# Patient Record
Sex: Male | Born: 1965 | Race: White | Hispanic: No | Marital: Married | Smoking: Current some day smoker
Health system: Southern US, Community
[De-identification: ages and names within clinical notes are randomized; demographics above are authoritative.]

---

## 2003-10-02 ENCOUNTER — Inpatient Hospital Stay (HOSPITAL_COMMUNITY): Admission: EM | Admit: 2003-10-02 | Discharge: 2003-10-03 | Payer: Self-pay | Admitting: Emergency Medicine

## 2005-12-01 ENCOUNTER — Ambulatory Visit: Payer: Self-pay | Admitting: Family Medicine

## 2006-10-17 ENCOUNTER — Emergency Department (HOSPITAL_COMMUNITY): Admission: EM | Admit: 2006-10-17 | Discharge: 2006-10-17 | Payer: Self-pay | Admitting: Emergency Medicine

## 2007-02-25 ENCOUNTER — Ambulatory Visit: Payer: Self-pay | Admitting: Family Medicine

## 2008-01-25 IMAGING — CR DG CHEST 1V PORT
2 series · 2 of 2 positions shown · non-contrast
Comparison: 10/02/2003

CLINICAL DATA: Chest pain. 
 PORTABLE CHEST - 10/17/2006 AT 0449 HOURS:

[view not recorded (1 of 2)]
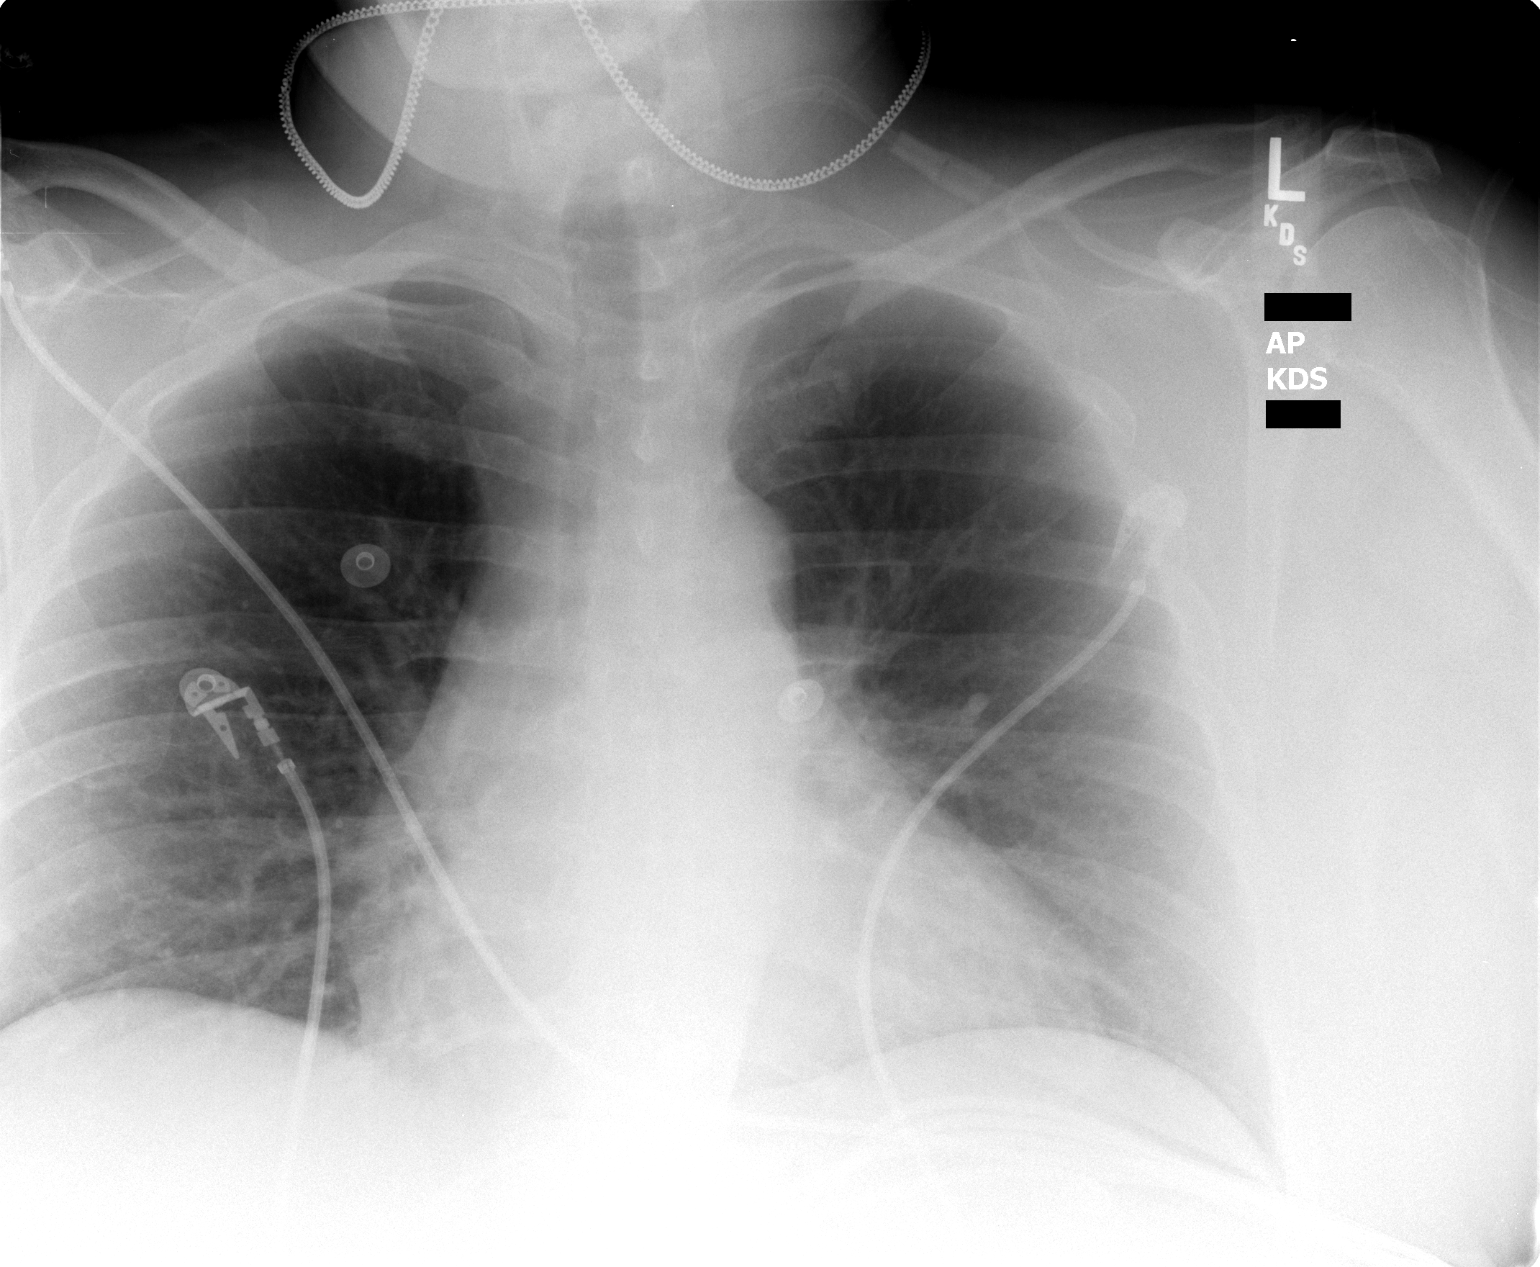

[view not recorded (2 of 2)]
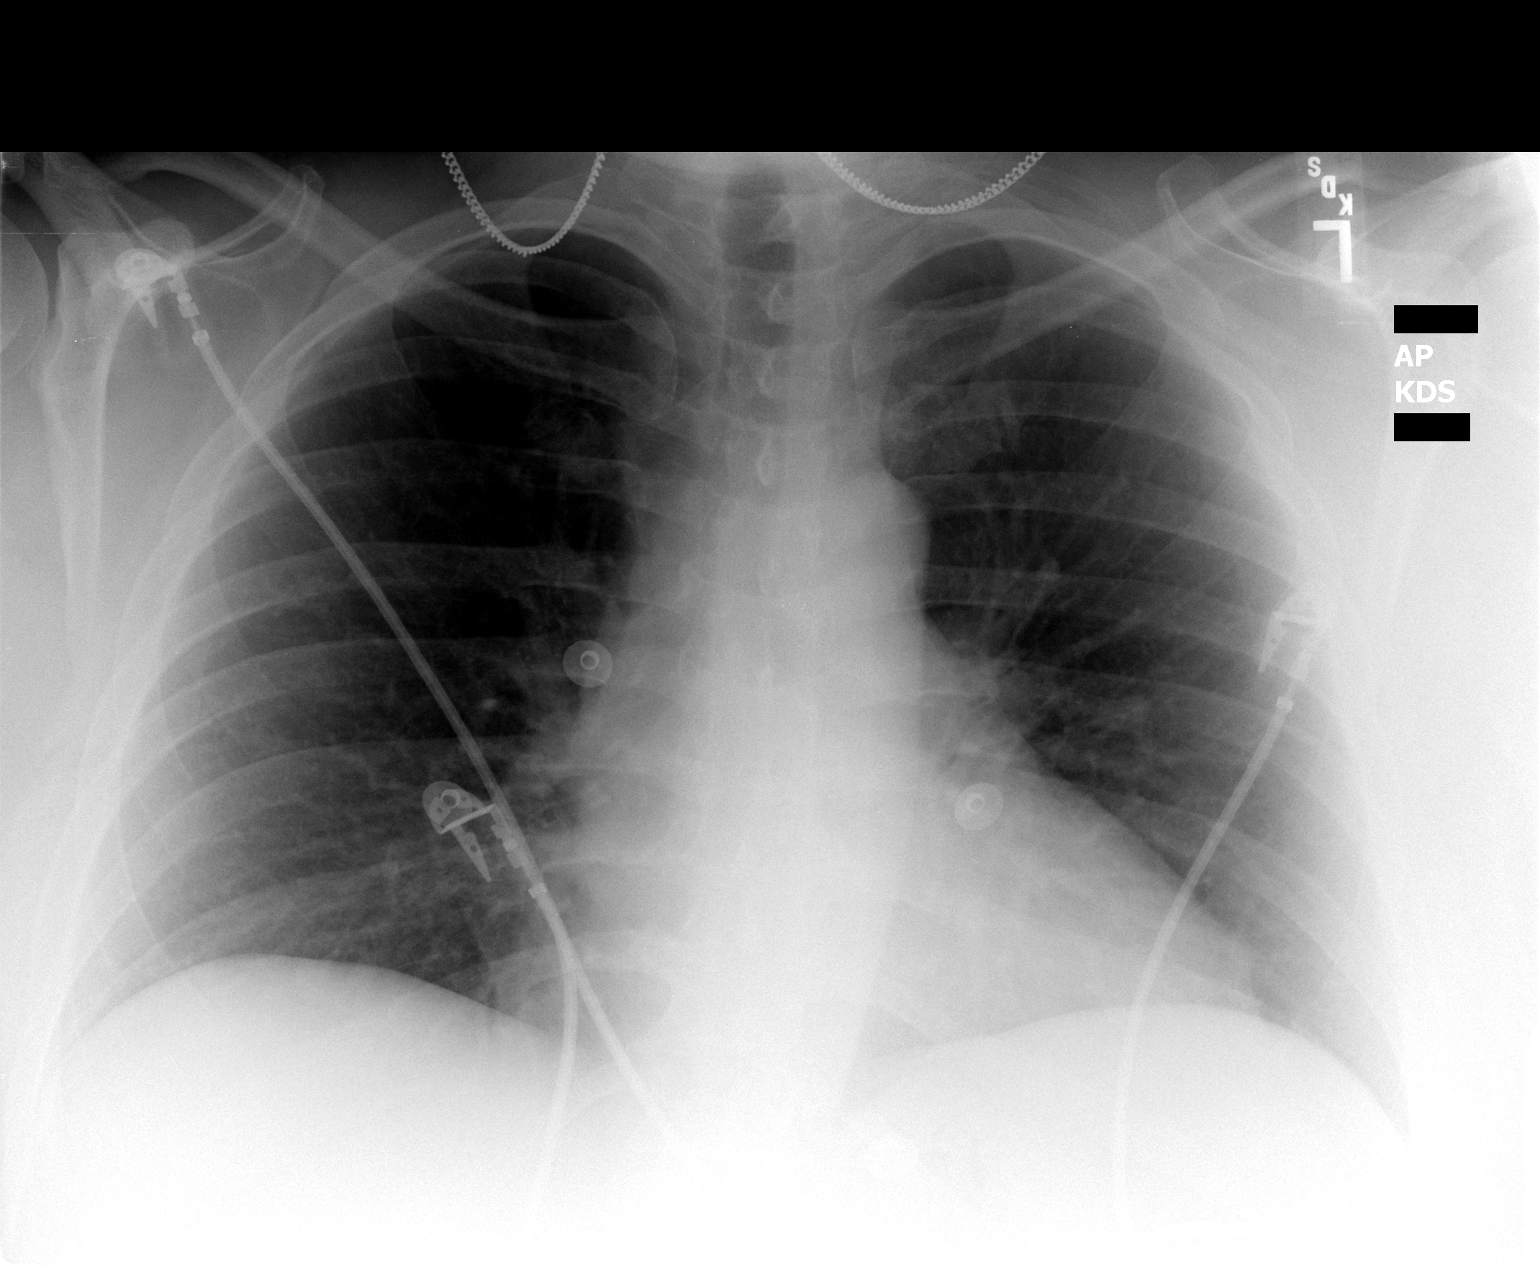

[2 of 2 positions shown; findings below may reference images not displayed]

FINDINGS: The lungs are clear.  Cardiomegaly is stable.  No bony abnormality is seen.  Stable cardiomegaly.
IMPRESSION: No active lung disease.

## 2008-03-30 ENCOUNTER — Ambulatory Visit: Payer: Self-pay | Admitting: Family Medicine

## 2010-06-24 ENCOUNTER — Encounter (INDEPENDENT_AMBULATORY_CARE_PROVIDER_SITE_OTHER): Payer: PRIVATE HEALTH INSURANCE | Admitting: Family Medicine

## 2010-06-24 DIAGNOSIS — E669 Obesity, unspecified: Secondary | ICD-10-CM

## 2010-06-24 DIAGNOSIS — Z0000 Encounter for general adult medical examination without abnormal findings: Secondary | ICD-10-CM

## 2010-08-08 NOTE — Discharge Summary (Signed)
NAME:  TATEM, FESLER NO.:  [id number]   MEDICAL RECORD NO.:  [id number]                   PATIENT TYPE:  INP   LOCATION:  3733                                 FACILITY:  [facility]   PHYSICIAN:  Nicki Guadalajara, M.D.                  DATE OF BIRTH:  Apr 22, 1965   DATE OF ADMISSION:  10/02/2003  DATE OF DISCHARGE:  10/03/2003                                 DISCHARGE SUMMARY   ADMISSION DIAGNOSES:  1. Chest pain.  2. Morbid obesity.  3. Tobacco use.  4. Anxiety.   DISCHARGE DIAGNOSES:  1. Chest pain.  Status post negative cardiac catheterization.  Status post     negative D-dimer.  Felt that chest pain was most likely related to     gastroesophageal reflux disease.  2. Morbid obesity.  3. Tobacco use.  4. Anxiety.  5. Status post cardiac catheterization on October 02, 2003 by Dr. Daphene Jaeger.     This revealed normal coronary arteries, normal left ventricular function.  6. Possible obstructive sleep apnea.  Will need follow up outpatient sleep     study.   HISTORY OF PRESENT ILLNESS:  Mr. Godeaux is a 45 year old white male who  awoke on the day of admission and had some chest discomfort and dizziness.  He went to work, but the pain persisted.  He describes it as a tightness,  almost like gas.  There was some radiation to the neck and the left leg, and  also had left leg discomfort.  The symptoms did not improve, and he finally  went to the [facility].  He was given nitroglycerin with relief.  They saw  some changes on his EKG, and had him sent to [facility] emergency room.  He  was given 2 more nitroglycerin via EMS.  His initial cardiac markers are  negative.  EKG in the emergency room shows sinus rhythm with no ST-T change.  He is still having some discomfort, which is mild, but is not in significant  distress at the time of our evaluation.   PHYSICAL EXAMINATION:  VITAL SIGNS:  He is stable with a blood pressure of  140/83, heart rate 82.  No  significant abnormality on exam.   At this point, he was seen and evaluated by Dr. Daphene Jaeger.  He reviews that  the chest pain had been a 7/10, and had improved, but not completely  resolved after getting 3 nitroglycerin.  The patient was still experiencing  some chest heaviness.  He was being treated with IV heparin, IV  nitroglycerin, and aspirin.  Dr. Tresa Endo reviewed the possible need to proceed  with definitive cardiac catheterization, given the patient's ongoing chest  pain, with the patient.  The patient understood risks and benefits of the  procedure, and was agreeable to proceed.   HOSPITAL COURSE:  On September 02, 2003, he underwent cardiac catheterization by  Dr. Daphene Jaeger.  He was found to have normal coronary arteries, normal left  ventricular function.  He tolerated this well without complications.  At  this point, we planned for lifestyle modifications including tobacco  cessation, weight loss, and exercise, as well as follow up his lipid status.   On the morning of October 03, 2003, Mr. Major was feeling well.  He is  afebrile, blood pressure is 109/63, heart rate 63, oxygen saturation 97% on  room air.  Cardiac enzymes are negative.  We had ordered a D-dimer in the  ER, which has not yet been done.  His catheterization site is well healed.  Lungs are clear.  Heart had regular rhythm without murmur, rub, or gallop.  Denied any chest pain.  At this point, he was seen and evaluated by Dr. Lavonne Chick.  At this point, the D-dimer results are pending.  It is felt that if  the D-dimer is normal, then the patient will be deemed stable for discharge  home.   It was felt at that time that his chest pain was most likely GI related.   HOSPITAL CONSULTS:  None.   HOSPITAL PROCEDURES:  Cardiac catheterization on October 02, 2003 by Dr. Daphene Jaeger.  Please see his dictated report for all findings.  He was found to  have normal coronary arteries and normal left ventricular function.  He   tolerated the procedure well without complication.   LABORATORY DATA:  Cardiac markers in the ER negative x2.  Cardiac enzymes  show CK's of 69, 57, 64.  CK-MB's are 1.1, 0.8, and 1.1.  Troponin is 0.01,  0.01, and 0.01.  Lipid profile shows a total cholesterol of 180,  triglycerides of 168, HDL 40, LDL 106.  There was another lipid profile done  that shows a total cholesterol of 190, triglycerides 95, HDL 49, LDL 122.  Pro time 12.9, INR of 1.0.  PTT 28.  D-dimer is 0.22, which is normal.  White count is 9.7, hemoglobin 15.1, hematocrit 43.3, platelets 271.  Sodium  138, potassium 4.1, BUN 13, creatinine 1.0.   Chest x-ray on October 02, 2003 shows no acute disease.   EKG shows normal sinus rhythm at 76 beats per minute.  He does have some  poor anterior R wave progression.  Nonspecific ST-T change.   DISCHARGE MEDICATIONS:  1. Zoloft 50 mg daily.  2. Nexium 40 mg once a day.  3. After he completes his one month, he could go to over-the-counter PPI,     such as Prilosec or Pepcid.   ACTIVITY:  No strenuous activity, lifting greater than 5 pounds, driving, or  sexual activity for 3 days.   DIET:  Low-fat diet.   DISCHARGE INSTRUCTIONS:  1. Call [phone] for any bloating or increased redness in the groin site.  2. He is supposed to contact Dr. __________ for follow up.  3. We have discussed diet and exercise changes.  4. The office was to call the patient with an appointment to see Dr. Tresa Endo     in the next 2-3 weeks.      Mary B. Easley, P.A.-C.                   Nicki Guadalajara, M.D.    MBE/MEDQ  D:  11/05/2003  T:  11/05/2003  Job:  [id number]   cc:   Julieanne Manson(?), M.D.

## 2010-08-08 NOTE — Cardiovascular Report (Signed)
NAME:  Corey Dyer NO.:  [id number]   MEDICAL RECORD NO.:  [id number]                   PATIENT TYPE:  INP   LOCATION:  3733                                 FACILITY:  [facility]   PHYSICIAN:  Nicki Guadalajara, M.D.                  DATE OF BIRTH:  [birth year]   DATE OF PROCEDURE:  10/02/2003  DATE OF DISCHARGE:                              CARDIAC CATHETERIZATION   INDICATIONS:  [name] is a 45 year old white male who has a 21-  year history of tobacco use, currently smoking one pack per day.  He also  has a history of significant obesity and is fairly sedentary but does have a  fairly stressful job working at __________ in [place].  He does have a  history of indigestion symptomatology.  Today approximately at 6:30 a.m. he  developed new-onset substernal chest pressure.  This seemed to radiate from  his left aspect of his chest to the center of the chest.  He became  diaphoretic and clammy as well as associated dizziness.  His chest pain  persisted off and on while he was attempting to work.  He ultimately  presented to [facility] in [place], where his symptoms were improved but  not totally relieved with sublingual nitroglycerin.  His ECG showed poor  anterior R-wave progression.  The patient was transported to [facility]. Upon arrival he continued to have substernal chest tightness and  pressure.  The additional symptoms seemed to improve with nitroglycerin  administration sublingually as well as intravenously.  His ECG showed a T-  wave inversion in lead III with poor R-wave progression anteriorly.  Because  of chest pain of six hours' duration, his young age with risk factors,  definitive diagnostic cardiac catheterization was recommended.   PROCEDURE:  Upon arrival to the cardiac catheterization laboratory, the  patient was pain-free on IV nitroglycerin drip.  His right femoral artery  was punctured anteriorly and a 5  French sheath was inserted.  Diagnostic  catheterization was done with 5 French Judkins 4 left and right coronary  catheters.  A pigtail catheter was used for biplane cine left  ventriculography.  Distal aortography was also performed.  Hemostasis was  attained by direct manual pressure.  The patient tolerated the procedure  well and returned to his room in satisfactory condition.   HEMODYNAMIC DATA:  Central aortic pressure is 130/89, left ventricular  pressure 130/10, post A-wave 15.   ANGIOGRAPHIC DATA:  1. Left main coronary artery was a very short vessel, which essentially     immediately bifurcated into an LAD and left circumflex system.  2. The LAD was angiographically normal and extended and wrapped around the     LV apex.  This vessel gave rise to several septal perforating arteries,     several small diagonal vessels, and one major diagonal vessel.  The  vessel and its branches were angiographically normal.  3. The circumflex vessel was a co-dominant vessel that supplied two proximal     marginal vessels, a distal third marginal vessel, and ended in a     posterolateral coronary artery.  The circumflex was angiographically     normal.  4. The right coronary artery was angiographically normal and gave rise to a     PDA, with small distal RCA system.   Biplane cine left ventriculography revealed normal global LV contractility.  There were no focal segmental wall motion abnormalities.   Distal aortography did not demonstrate any renal artery stenosis.  There was  no significant aortoiliac disease.   IMPRESSION:  1. Normal left ventricular function.  2. Normal coronary arteries.                                               Nicki Guadalajara, M.D.    TK/MEDQ  D:  10/02/2003  T:  10/03/2003  Job:  [id number]   cc:   Sharlot Gowda, M.D.  [address]  [place], [place] [address]  Fax: [phone]   Orville Govern   [facility] [address], [place]

## 2011-01-05 LAB — POCT CARDIAC MARKERS
Myoglobin, poc: 48.2
Operator id: 196461

## 2011-01-05 LAB — I-STAT 8, (EC8 V) (CONVERTED LAB)
BUN: 9
Bicarbonate: 25.5 — ABNORMAL HIGH
HCT: 49
Sodium: 141
TCO2: 27
pH, Ven: 7.375 — ABNORMAL HIGH

## 2011-01-05 LAB — POCT I-STAT CREATININE: Operator id: 196461

## 2013-05-04 ENCOUNTER — Emergency Department (HOSPITAL_COMMUNITY)
Admission: EM | Admit: 2013-05-04 | Discharge: 2013-05-04 | Disposition: A | Discharge status: Home / Self Care | Payer: PRIVATE HEALTH INSURANCE | Attending: Emergency Medicine | Admitting: Emergency Medicine

## 2013-05-04 ENCOUNTER — Emergency Department (HOSPITAL_COMMUNITY): Payer: PRIVATE HEALTH INSURANCE

## 2013-05-04 ENCOUNTER — Encounter (HOSPITAL_COMMUNITY): Payer: Self-pay | Admitting: Emergency Medicine

## 2013-05-04 DIAGNOSIS — Z79899 Other long term (current) drug therapy: Secondary | ICD-10-CM | POA: Insufficient documentation

## 2013-05-04 DIAGNOSIS — R296 Repeated falls: Secondary | ICD-10-CM | POA: Insufficient documentation

## 2013-05-04 DIAGNOSIS — S82839A Other fracture of upper and lower end of unspecified fibula, initial encounter for closed fracture: Secondary | ICD-10-CM

## 2013-05-04 DIAGNOSIS — F17200 Nicotine dependence, unspecified, uncomplicated: Secondary | ICD-10-CM | POA: Insufficient documentation

## 2013-05-04 DIAGNOSIS — S82899A Other fracture of unspecified lower leg, initial encounter for closed fracture: Secondary | ICD-10-CM | POA: Insufficient documentation

## 2013-05-04 DIAGNOSIS — Y9301 Activity, walking, marching and hiking: Secondary | ICD-10-CM | POA: Insufficient documentation

## 2013-05-04 DIAGNOSIS — X500XXA Overexertion from strenuous movement or load, initial encounter: Secondary | ICD-10-CM | POA: Insufficient documentation

## 2013-05-04 DIAGNOSIS — Y929 Unspecified place or not applicable: Secondary | ICD-10-CM | POA: Insufficient documentation

## 2013-05-04 MED ORDER — HYDROCODONE-ACETAMINOPHEN 5-325 MG PO TABS: 1.0000 | ORAL_TABLET | ORAL | Status: DC | PRN

## 2013-05-04 MED ORDER — IBUPROFEN 800 MG PO TABS: 800.0000 mg | ORAL_TABLET | Freq: Three times a day (TID) | ORAL | Status: DC

## 2013-05-04 NOTE — ED Provider Notes (Signed)
CSN: [id number]     Arrival date & time 05/04/13  [id number] History   First MD Initiated Contact with Patient 05/04/13 [phone]     Chief Complaint  Patient presents with  . Ankle Pain     (Consider location/radiation/quality/duration/timing/severity/associated sxs/prior Treatment) HPI Corey Dyer is a 48 y.o. male who presents to emergency department complaining of right ankle injury. Patient states that he was walking down the steps from his back when the last step of the deck cracked and broke, and he states he twisted his right ankle and fell down. Patient states this happened 2 days ago. Patient states that he has been at home since then, elevating his ankle, icing, but states this morning pain has worsened. Patient states pain is worsened with weightbearing and with movement of the ankle. He denies any numbness or weakness in the foot. He denies any other injuries. He denies any pain in his knee. He did not take any medications for this.    History reviewed. No pertinent past medical history. History reviewed. No pertinent past surgical history. No family history on file. History  Substance Use Topics  . Smoking status: Current Some Day Smoker -- 0.10 packs/day    Types: Cigarettes  . Smokeless tobacco: Not on file  . Alcohol Use: Yes    Review of Systems  Constitutional: Negative for fever and chills.  Respiratory: Negative for cough, chest tightness and shortness of breath.   Cardiovascular: Negative for chest pain, palpitations and leg swelling.  Musculoskeletal: Positive for arthralgias and joint swelling.  Skin: Negative for rash.  Allergic/Immunologic: Negative for immunocompromised state.  Neurological: Negative for weakness and numbness.      Allergies  Review of patient's allergies indicates no known allergies.  Home Medications   Current Outpatient Rx  Name  Route  Sig  Dispense  Refill  . Famotidine (PEPCID AC PO)   Oral   Take 1 tablet by mouth daily.        Marland Kitchen. ibuprofen (ADVIL,MOTRIN) 200 MG tablet   Oral   Take 400 mg by mouth every 6 (six) hours as needed for mild pain or moderate pain.          BP 154/105  Pulse 88  Temp(Src) 98.9 F (37.2 C) (Oral)  Resp 20  SpO2 93% Physical Exam  Nursing note and vitals reviewed. Constitutional: He is oriented to person, place, and time. He appears well-developed and well-nourished. No distress.  HENT:  Head: Normocephalic and atraumatic.  Eyes: Conjunctivae are normal.  Neck: Neck supple.  Cardiovascular: Normal rate, regular rhythm and normal heart sounds.   Pulmonary/Chest: Effort normal. No respiratory distress. He has no wheezes. He has no rales.  Musculoskeletal: He exhibits no edema.  Normal appearing right ankle, with maybe just mild swelling over lateral malleolus. Tender to palpation over lateral malleolus. No tenderness over proximal fibula, normal knee, Achilles tendon nontender and is intact. The tenderness over medial malleolus. Foot is normal. Cap refill normal distally. Pain with dorsiflexion, plantar flexion, inversion.  Neurological: He is alert and oriented to person, place, and time.  Skin: Skin is warm and dry.    ED Course  Procedures (including critical care time) Labs Review Labs Reviewed - No data to display Imaging Review Dg Ankle Complete Right  05/04/2013   CLINICAL DATA:  Lateral and posterior right ankle pain.  EXAM: RIGHT ANKLE - COMPLETE 3+ VIEW  COMPARISON:  None.  FINDINGS: The patient has a nondisplaced, acute spiral fracture of the  distal diaphysis and metaphysis of the right fibula with associated soft tissue swelling. No other acute bony or joint abnormality is identified. Small plantar calcaneal spur is noted.  IMPRESSION: Nondisplaced spiral fracture distal fibula.   Electronically Signed   By: Drusilla Kanner M.D.   On: 05/04/2013 08:40    EKG Interpretation   None       MDM   Final diagnoses:  Fracture of distal fibula    Patient with  right ankle fracture, spiral fracture seen on the x-ray of the distal fibula. Will splint, crutches provided. Followup with orthopedics Dr. He is neurovascularly intact otherwise.  Filed Vitals:   05/04/13 0810  BP: 154/105  Pulse: 88  Temp: 98.9 F (37.2 C)  TempSrc: Oral  Resp: 20  SpO2: 93%      Keghan Mcfarren A Cerys Winget, PA-C 05/04/13 1052

## 2013-05-04 NOTE — Discharge Instructions (Signed)
Take ibuprofen for pain. norco for severe pain. Keep leg elevated. Ice. Follow up with orthopedics doctor next week.    Ankle Fracture A fracture is a break in the bone. A cast or splint is used to protect and keep your injured bone from moving.  HOME CARE INSTRUCTIONS   Use your crutches as directed.  To lessen the swelling, keep the injured leg elevated while sitting or lying down.  Apply ice to the injury for 15-20 minutes, 03-04 times per day while awake for 2 days. Put the ice in a plastic bag and place a thin towel between the bag of ice and your cast.  If you have a plaster or fiberglass cast:  Do not try to scratch the skin under the cast using sharp or pointed objects.  Check the skin around the cast every day. You may put lotion on any red or sore areas.  Keep your cast dry and clean.  If you have a plaster splint:  Wear the splint as directed.  You may loosen the elastic around the splint if your toes become numb, tingle, or turn cold or blue.  Do not put pressure on any part of your cast or splint; it may break. Rest your cast only on a pillow the first 24 hours until it is fully hardened.  Your cast or splint can be protected during bathing with a plastic bag. Do not lower the cast or splint into water.  Take medications as directed by your caregiver. Only take over-the-counter or prescription medicines for pain, discomfort, or fever as directed by your caregiver.  Do not drive a vehicle until your caregiver specifically tells you it is safe to do so.  If your caregiver has given you a follow-up appointment, it is very important to keep that appointment. Not keeping the appointment could result in a chronic or permanent injury, pain, and disability. If there is any problem keeping the appointment, you must call back to this facility for assistance. SEEK IMMEDIATE MEDICAL CARE IF:   Your cast gets damaged or breaks.  You have continued severe pain or more swelling  than you did before the cast was put on.  Your skin or toenails below the injury turn blue or gray, or feel cold or numb.  There is a bad smell or new stains and/or purulent (pus like) drainage coming from under the cast. If you do not have a window in your cast for observing the wound, a discharge or minor bleeding may show up as a stain on the outside of your cast. Report these findings to your caregiver. MAKE SURE YOU:   Understand these instructions.  Will watch your condition.  Will get help right away if you are not doing well or get worse. Document Released: 03/06/2000 Document Revised: 06/01/2011 Document Reviewed: 10/06/2012 [facility] Patient Information 2014 [place], [place].

## 2013-05-04 NOTE — ED Provider Notes (Signed)
Medical screening examination/treatment/procedure(s) were performed by non-physician practitioner and as supervising physician I was immediately available for consultation/collaboration.  EKG Interpretation   None         Dagmar HaitWilliam Annaliese Saez, MD 05/04/13 1112

## 2013-05-04 NOTE — ED Notes (Signed)
Pt hurt right ankle on Tuesday night; states edge of step on deck broke and pt fell; feels like ankle twisted

## 2013-06-19 ENCOUNTER — Other Ambulatory Visit (HOSPITAL_COMMUNITY): Payer: Self-pay | Admitting: Physical Medicine and Rehabilitation

## 2013-06-19 ENCOUNTER — Ambulatory Visit (HOSPITAL_COMMUNITY)
Admission: RE | Admit: 2013-06-19 | Discharge: 2013-06-19 | Disposition: A | Discharge status: Home / Self Care | Payer: PRIVATE HEALTH INSURANCE | Source: Ambulatory Visit | Attending: Physical Medicine and Rehabilitation | Admitting: Physical Medicine and Rehabilitation

## 2013-06-19 DIAGNOSIS — M79609 Pain in unspecified limb: Secondary | ICD-10-CM | POA: Insufficient documentation

## 2013-06-19 DIAGNOSIS — M7989 Other specified soft tissue disorders: Secondary | ICD-10-CM

## 2013-06-19 NOTE — Progress Notes (Signed)
Bilateral lower extremity venous duplex completed.  Right:  DVT noted in the distal femoral, popliteal, and posterior tibial veins.  No evidence of superficial thrombosis.  No Baker's cyst.  Left:  No evidence of DVT, superficial thrombosis, or Baker's cyst.  . 

## 2013-06-20 ENCOUNTER — Encounter: Payer: Self-pay | Admitting: Family Medicine

## 2013-06-20 ENCOUNTER — Ambulatory Visit (INDEPENDENT_AMBULATORY_CARE_PROVIDER_SITE_OTHER): Payer: PRIVATE HEALTH INSURANCE | Admitting: Family Medicine

## 2013-06-20 VITALS — BP 130/98 | HR 80 | Ht 69.0 in | Wt 270.0 lb

## 2013-06-20 DIAGNOSIS — I82409 Acute embolism and thrombosis of unspecified deep veins of unspecified lower extremity: Secondary | ICD-10-CM

## 2013-06-20 NOTE — Patient Instructions (Signed)
Deep Vein Thrombosis A deep vein thrombosis (DVT) is a blood clot that develops in the deep, larger veins of the leg, arm, or pelvis. These are more dangerous than clots that might form in veins near the surface of the body. A DVT can lead to complications if the clot breaks off and travels in the bloodstream to the lungs.  A DVT can damage the valves in your leg veins, so that instead of flowing upward, the blood pools in the lower leg. This is called post-thrombotic syndrome, and it can result in pain, swelling, discoloration, and sores on the leg. CAUSES Usually, several things contribute to blood clots forming. Contributing factors include:  The flow of blood slows down.  The inside of the vein is damaged in some way.  You have a condition that makes blood clot more easily. RISK FACTORS Some people are more likely than others to develop blood clots. Risk factors include:   Older age, especially over 75 years of age.  Having a family history of blood clots or if you have already had a blot clot.  Having major or lengthy surgery. This is especially true for surgery on the hip, knee, or belly (abdomen). Hip surgery is particularly high risk.  Breaking a hip or leg.  Sitting or lying still for a long time. This includes long-distance travel, paralysis, or recovery from an illness or surgery.  Having cancer or cancer treatment.  Having a long, thin tube (catheter) placed inside a vein during a medical procedure.  Being overweight (obese).  Pregnancy and childbirth.  Hormone changes make the blood clot more easily during pregnancy.  The fetus puts pressure on the veins of the pelvis.  There is a risk of injury to veins during delivery or a caesarean. The risk is highest just after childbirth.  Medicines with the male hormone estrogen. This includes birth control pills and hormone replacement therapy.  Smoking.  Other circulation or heart problems.  SIGNS AND SYMPTOMS When  a clot forms, it can either partially or totally block the blood flow in that vein. Symptoms of a DVT can include:  Swelling of the leg or arm, especially if one side is much worse.  Warmth and redness of the leg or arm, especially if one side is much worse.  Pain in an arm or leg. If the clot is in the leg, symptoms may be more noticeable or worse when standing or walking. The symptoms of a DVT that has traveled to the lungs (pulmonary embolism, PE) usually start suddenly and include:  Shortness of breath.  Coughing.  Coughing up blood or blood-tinged phlegm.  Chest pain. The chest pain is often worse with deep breaths.  Rapid heartbeat. Anyone with these symptoms should get emergency medical treatment right away. Call your local emergency services (911 in the [place] if you have these symptoms. DIAGNOSIS If a DVT is suspected, your health care provider will take a full medical history and perform a physical exam. Tests that also may be required include:  Blood tests, including studies of the clotting properties of the blood.  Ultrasonography to see if you have clots in your legs or lungs.  X-rays to show the flow of blood when dye is injected into the veins (venography).  Studies of your lungs if you have any chest symptoms. PREVENTION  Exercise the legs regularly. Take a brisk 30-minute walk every day.  Maintain a weight that is appropriate for your height.  Avoid sitting or lying in bed   for long periods of time without moving your legs.  Women, particularly those over the age of 45 years, should consider the risks and benefits of taking estrogen medicines, including birth control pills.  Do not smoke, especially if you take estrogen medicines.  Long-distance travel can increase your risk of DVT. You should exercise your legs by walking or pumping the muscles every hour.  In-hospital prevention:  Many of the risk factors above relate to situations that exist with  hospitalization, either for illness, injury, or elective surgery.  Your health care provider will assess you for the need for venous thromboembolism prophylaxis when you are admitted to the hospital. If you are having surgery, your surgeon will assess you the day of or day after surgery.  Prevention may include medical and nonmedical measures. TREATMENT Once identified, a DVT can be treated. It can also be prevented in some circumstances. Once you have had a DVT, you may be at increased risk for a DVT in the future. The most common treatment for DVT is blood thinning (anticoagulant) medicine, which reduces the blood's tendency to clot. Anticoagulants can stop new blood clots from forming and stop old ones from growing. They cannot dissolve existing clots. Your body does this by itself over time. Anticoagulants can be given by mouth, by IV access, or by injection. Your health care provider will determine the best program for you. Other medicines or treatments that may be used are:  Heparin or related medicines (low molecular weight heparin) are usually the first treatment for a blood clot. They act quickly. However, they cannot be taken orally.  Heparin can cause a fall in a component of blood that stops bleeding and forms blood clots (platelets). You will be monitored with blood tests to be sure this does not occur.  Warfarin is an anticoagulant that can be swallowed. It takes a few days to start working, so usually heparin or related medicines are used in combination. Once warfarin is working, heparin is usually stopped.  Less commonly, clot dissolving drugs (thrombolytics) are used to dissolve a DVT. They carry a high risk of bleeding, so they are used mainly in severe cases, where your life or a limb is threatened.  Very rarely, a blood clot in the leg needs to be removed surgically.  If you are unable to take anticoagulants, your health care provider may arrange for you to have a filter placed  in a main vein in your abdomen. This filter prevents clots from traveling to your lungs. HOME CARE INSTRUCTIONS  Take all medicines prescribed by your health care provider. Only take over-the-counter or prescription medicines for pain, fever, or discomfort as directed by your health care provider.  Warfarin. Most people will continue taking warfarin after hospital discharge. Your health care provider will advise you on the length of treatment (usually 3 6 months, sometimes lifelong).  Too much and too little warfarin are both dangerous. Too much warfarin increases the risk of bleeding. Too little warfarin continues to allow the risk for blood clots. While taking warfarin, you will need to have regular blood tests to measure your blood clotting time. These blood tests usually include both the prothrombin time (PT) and international normalized ratio (INR) tests. The PT and INR results allow your health care provider to adjust your dose of warfarin. The dose can change for many reasons. It is critically important that you take warfarin exactly as prescribed, and that you have your PT and INR levels drawn exactly as directed.  Many foods, especially foods high in vitamin K, can interfere with warfarin and affect the PT and INR results. Foods high in vitamin K include spinach, kale, broccoli, cabbage, collard and turnip greens, brussel sprouts, peas, cauliflower, seaweed, and parsley as well as beef and pork liver, green tea, and soybean oil. You should eat a consistent amount of foods high in vitamin K. Avoid major changes in your diet, or notify your health care provider before changing your diet. Arrange a visit with a dietitian to answer your questions.  Many medicines can interfere with warfarin and affect the PT and INR results. You must tell your health care provider about any and all medicines you take. This includes all vitamins and supplements. Be especially cautious with aspirin and  anti-inflammatory medicines. Ask your health care provider before taking these. Do not take or discontinue any prescribed or over-the-counter medicine except on the advice of your health care provider or pharmacist.  Warfarin can have side effects, primarily excessive bruising or bleeding. You will need to hold pressure over cuts for longer than usual. Your health care provider or pharmacist will discuss other potential side effects.  Alcohol can change the body's ability to handle warfarin. It is best to avoid alcoholic drinks or consume only very small amounts while taking warfarin. Notify your health care provider if you change your alcohol intake.  Notify your dentist or other health care providers before procedures.  Activity. Ask your health care provider how soon you can go back to normal activities. It is important to stay active to prevent blood clots. If you are on anticoagulant medicine, avoid contact sports.  Exercise. It is very important to exercise. This is especially important while traveling, sitting, or standing for long periods of time. Exercise your legs by walking or by pumping the muscles frequently. Take frequent walks.  Compression stockings. These are tight elastic stockings that apply pressure to the lower legs. This pressure can help keep the blood in the legs from clotting. You may need to wear compression stockings at home to help prevent a DVT.  Do not smoke. If you smoke, quit. Ask your health care provider for help with quitting smoking.  Learn as much as you can about DVT. Knowing more about the condition should help you keep it from coming back.  Wear a medical alert bracelet or carry a medical alert card. SEEK MEDICAL CARE IF:  You notice a rapid heartbeat.  You feel weaker or more tired than usual.  You feel faint.  You notice increased bruising.  You feel your symptoms are not getting better in the time expected.  You believe you are having side  effects of medicine. SEEK IMMEDIATE MEDICAL CARE IF:  You have chest pain.  You have trouble breathing.  You have new or increased swelling or pain in one leg.  You cough up blood.  You notice blood in vomit, in a bowel movement, or in urine. MAKE SURE YOU:  Understand these instructions.  Will watch your condition.  Will get help right away if you are not doing well or get worse. Document Released: 03/09/2005 Document Revised: 12/28/2012 Document Reviewed: 11/14/2012 [facility] Patient Information 2014 [place], [place]. For the rest of this week keep your foot elevated. Heat for 20 minutes 3 times per day. Tylenol for the discomfort. Once this quiets down I want you up moving around as much is possible but when you sit down then I want you to raise your leg. If you travel  this weekend, keep your leg elevated

## 2013-06-20 NOTE — Progress Notes (Signed)
   Subjective:    Patient ID: Corey Dyer, male    DOB: 01-20-1966, 48 y.o.   MRN: [id number]  HPI He is here for consult concerning recent diagnosis of DVT. He sustained a spiral fracture to his right fibula in early February. His he was originally placed in a cast and then a boot. Approximately one week ago he was switched to a brace. He was using a scooter until approximately 2 weeks ago and then has been slowly putting more and more weight on it. Approximately one week ago he was taken out of the boot and put in a brace which is about the same time he noted some swelling and discomfort in his calf. He had been using ice and elevating this as much is possible. Yesterday a Doppler was ordered which did show DVT. He has had no chest pain, shortness of breath, coughing, weakness   Review of Systems     Objective:   Physical Exam Alert and in no distress. Respiratory rate is normal. Right calf does show positive Homans sign. It is not hot or red.       Assessment & Plan:  DVT (deep venous thrombosis)  he was started on XARELTO 15 mg twice a day last night. He will continue on that for 3 weeks. Discussed elevating his leg and using heat on it as well as taking Tylenol. Recommend he avoid work for the rest of the week. Also discussed possible travel this weekend and encouraged him to not drive and keep his leg elevated if he does: A trip. Also discussed cough, shortness of breath and chest pain and the need to go to the emergency room if that occurred.

## 2013-06-26 ENCOUNTER — Telehealth: Payer: Self-pay | Admitting: Internal Medicine

## 2013-06-26 NOTE — Telephone Encounter (Signed)
See note from Dr. Susann GivensLalonde - needs f/u OV

## 2013-06-26 NOTE — Telephone Encounter (Signed)
Pt has appt scheduled.

## 2013-06-26 NOTE — Telephone Encounter (Signed)
Pt does not have any compression hoses. Does he need to get some compression hoses?

## 2013-06-26 NOTE — Telephone Encounter (Signed)
After looking over Dr. Jola BabinskiLalonde's note from recent visit, he was diagnosed with DVT/blood clot.  Thus, the blood clot will be there for another 3-4 months, but that is why he was started on Xarelto to help this clot resolve and to prevent another clot.  Other treatment includes leg elevated, compression hose, and time.  If there are worse swelling or redness since his recent visit, then I would recommend a recheck OV.

## 2013-06-26 NOTE — Telephone Encounter (Signed)
Pt states he has blood clots in his legs and he is still having swelling and redness. Is this normal or should he be worried?

## 2013-06-26 NOTE — Telephone Encounter (Signed)
Schedule him for a followup visit next week or later this week whatever his convenient for him

## 2013-07-04 ENCOUNTER — Ambulatory Visit (INDEPENDENT_AMBULATORY_CARE_PROVIDER_SITE_OTHER): Payer: PRIVATE HEALTH INSURANCE | Admitting: Family Medicine

## 2013-07-04 ENCOUNTER — Encounter: Payer: Self-pay | Admitting: Family Medicine

## 2013-07-04 VITALS — BP 130/72 | HR 84 | Wt 265.0 lb

## 2013-07-04 DIAGNOSIS — I82409 Acute embolism and thrombosis of unspecified deep veins of unspecified lower extremity: Secondary | ICD-10-CM

## 2013-07-04 MED ORDER — RIVAROXABAN 20 MG PO TABS: 20.0000 mg | ORAL_TABLET | Freq: Every day | ORAL | Status: DC

## 2013-07-04 NOTE — Progress Notes (Signed)
   Subjective:    Patient ID: Rennis Chrisonald W Marian, male    DOB: 09-10-1965, 48 y.o.   MRN: [id number]  HPI He is here for recheck. He does have a history of right DVT. He is now 2 weeks into XARELTO. He has had no chest pain, shortness of breath however his right leg is still slightly swollen and this has him concerned. He has been back to work for at least a week and seems to be doing well.   Review of Systems     Objective:   Physical Exam Alert and in no distress. Negative Homans sign. The right leg is slightly swollen.       Assessment & Plan:  DVT (deep venous thrombosis)  reassured him that the swelling would eventually quieted down. He will continue on his present dosing of XARELTO for one more week and then be switched to 20 mg. I will then see him at the end of the 6 month course of treatment. Also discussed possible side effects from the [facility].

## 2013-12-20 ENCOUNTER — Ambulatory Visit (INDEPENDENT_AMBULATORY_CARE_PROVIDER_SITE_OTHER): Payer: PRIVATE HEALTH INSURANCE | Admitting: Family Medicine

## 2013-12-20 ENCOUNTER — Encounter: Payer: Self-pay | Admitting: Family Medicine

## 2013-12-20 DIAGNOSIS — Z0000 Encounter for general adult medical examination without abnormal findings: Secondary | ICD-10-CM

## 2013-12-20 LAB — COMPREHENSIVE METABOLIC PANEL
ALBUMIN: 4.2 g/dL (ref 3.5–5.2)
ALT: 20 U/L (ref 0–53)
AST: 18 U/L (ref 0–37)
Alkaline Phosphatase: 71 U/L (ref 39–117)
BUN: 11 mg/dL (ref 6–23)
CO2: 23 mEq/L (ref 19–32)
Calcium: 9.2 mg/dL (ref 8.4–10.5)
Chloride: 102 mEq/L (ref 96–112)
Creat: 0.73 mg/dL (ref 0.50–1.35)
GLUCOSE: 82 mg/dL (ref 70–99)
POTASSIUM: 4.1 meq/L (ref 3.5–5.3)
SODIUM: 138 meq/L (ref 135–145)
TOTAL PROTEIN: 6.7 g/dL (ref 6.0–8.3)
Total Bilirubin: 0.8 mg/dL (ref 0.2–1.2)

## 2013-12-20 LAB — HEMOCCULT GUIAC POC 1CARD (OFFICE)

## 2013-12-20 LAB — LIPID PANEL
CHOLESTEROL: 176 mg/dL (ref 0–200)
HDL: 46 mg/dL (ref >39)
LDL CALC: 89 mg/dL (ref 0–99)
TRIGLYCERIDES: 204 mg/dL — AB (ref <150)
Total CHOL/HDL Ratio: 3.8 Ratio
VLDL: 41 mg/dL — AB (ref 0–40)

## 2013-12-20 NOTE — Progress Notes (Signed)
   Subjective:    Patient ID: Corey Dyer, male    DOB: 1965-06-18, 48 y.o.   MRN: [id number]  HPI He is here for complete examination. He recently finished a course of XARELTO for treatment of DVT. He is having no difficulty with his leg. This did interfere with his physical activity. His weight has been slowly getting worse. He has had no chest pain, shortness, coughing. He has no other concerns or complaints. He does have 3 children. His marriage is going well. Work is also going well. Family and social history were reviewed.   Review of Systems  All other systems reviewed and are negative.      Objective:   Physical Exam BP 130/90  Pulse 84  Ht 5\' 8"  (1.727 m)  Wt 270 lb (122.471 kg)  BMI 41.06 kg/m2  SpO2 97% General Appearance:    Alert, cooperative, no distress, appears stated age  Head:    Normocephalic, without obvious abnormality, atraumatic  Eyes:    PERRL, conjunctiva/corneas clear, EOM's intact, fundi    benign  Ears:    Normal TM's and external ear canals  Nose:   Nares normal, mucosa normal, no drainage or sinus   tenderness  Throat:   Lips, mucosa, and tongue normal; teeth and gums normal  Neck:   Supple, no lymphadenopathy;  thyroid:  no   enlargement/tenderness/nodules; no carotid   bruit or JVD  Back:    Spine nontender, no curvature, ROM normal, no CVA     tenderness  Lungs:     Clear to auscultation bilaterally without wheezes, rales or     ronchi; respirations unlabored  Chest Wall:    No tenderness or deformity   Heart:    Regular rate and rhythm, S1 and S2 normal, no murmur, rub   or gallop  Breast Exam:    No chest wall tenderness, masses or gynecomastia  Abdomen:     Soft, non-tender, nondistended, normoactive bowel sounds,    no masses, no hepatosplenomegaly  Genitalia:    Normal male external genitalia without lesions.  Testicles without masses.  No inguinal hernias.  Rectal:    Normal sphincter tone, no masses or tenderness; guaiac negative  stool.  Prostate smooth, no nodules, not enlarged.  Extremities:   No clubbing, cyanosis or edema  Pulses:   2+ and symmetric all extremities  Skin:   Skin color, texture, turgor normal, no rashes or lesions  Lymph nodes:   Cervical, supraclavicular, and axillary nodes normal  Neurologic:   CNII-XII intact, normal strength, sensation and gait; reflexes 2+ and symmetric throughout          Psych:   Normal mood, affect, hygiene and grooming.          Assessment & Plan:  Morbid obesity  Routine general medical examination at a health care facility - Plan: CBC with Differential, Comprehensive metabolic panel, Lipid panel, POCT occult blood stool Discussed diet and exercise with him. Encouraged him to get involved in a regular exercise program as well as making dietary changes especially in regard to carbohydrates. Recommend he set a goal of waist size rather than actual weight.

## 2013-12-20 NOTE — Patient Instructions (Signed)
150 minutes a week is what we normally ask people to do. Translates into roughly 20 minutes a day. Cut back on "white food". Look at portion sizes and do not skip meals. Set up particular waist size not weight

## 2013-12-21 LAB — CBC WITH DIFFERENTIAL/PLATELET
BASOS ABS: 0.1 10*3/uL (ref 0.0–0.1)
BASOS PCT: 1 % (ref 0–1)
EOS PCT: 2 % (ref 0–5)
Eosinophils Absolute: 0.1 10*3/uL (ref 0.0–0.7)
HCT: 45.5 % (ref 39.0–52.0)
Hemoglobin: 15.7 g/dL (ref 13.0–17.0)
LYMPHS PCT: 28 % (ref 12–46)
Lymphs Abs: 2 10*3/uL (ref 0.7–4.0)
MCH: 30 pg (ref 26.0–34.0)
MCHC: 34.5 g/dL (ref 30.0–36.0)
MCV: 86.8 fL (ref 78.0–100.0)
MONO ABS: 0.7 10*3/uL (ref 0.1–1.0)
Monocytes Relative: 9 % (ref 3–12)
NEUTROS ABS: 4.4 10*3/uL (ref 1.7–7.7)
NEUTROS PCT: 60 % (ref 43–77)
Platelets: 284 10*3/uL (ref 150–400)
RBC: 5.24 MIL/uL (ref 4.22–5.81)
RDW: 14.2 % (ref 11.5–15.5)
WBC: 7.3 10*3/uL (ref 4.0–10.5)

## 2014-02-26 ENCOUNTER — Encounter: Payer: Self-pay | Admitting: Podiatry

## 2014-02-26 ENCOUNTER — Ambulatory Visit (INDEPENDENT_AMBULATORY_CARE_PROVIDER_SITE_OTHER): Payer: PRIVATE HEALTH INSURANCE

## 2014-02-26 ENCOUNTER — Ambulatory Visit (INDEPENDENT_AMBULATORY_CARE_PROVIDER_SITE_OTHER): Payer: PRIVATE HEALTH INSURANCE | Admitting: Podiatry

## 2014-02-26 VITALS — BP 142/89 | HR 68 | Resp 16

## 2014-02-26 DIAGNOSIS — G5761 Lesion of plantar nerve, right lower limb: Secondary | ICD-10-CM

## 2014-02-26 DIAGNOSIS — M2041 Other hammer toe(s) (acquired), right foot: Secondary | ICD-10-CM

## 2014-02-26 DIAGNOSIS — G5781 Other specified mononeuropathies of right lower limb: Secondary | ICD-10-CM

## 2014-02-26 NOTE — Progress Notes (Signed)
Subjective:     Patient ID: Corey Dyer, male   DOB: September 12, 1965, 48 y.o.   MRN: [id number]  HPI patient presents stating he's been getting a lot of burning in his third interspace of his right foot with shooting pains into the adjacent digits and he's not sure as to why it is doing this but it's been present for about 8 months   Review of Systems  All other systems reviewed and are negative.      Objective:   Physical Exam  Constitutional: He is oriented to person, place, and time.  Cardiovascular: Intact distal pulses.   Musculoskeletal: Normal range of motion.  Neurological: He is oriented to person, place, and time.  Skin: Skin is warm.  Nursing note and vitals reviewed.  neurovascular status found to be intact with muscle strength adequate and range of motion subtalar midtarsal joint within normal limits. Patient is noted to have moderate shooting pain with  symptoms third interspace right upon palpation. 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

## 2014-02-26 NOTE — Progress Notes (Signed)
   Subjective:    Patient ID: Corey Dyer, male    DOB: 07/03/1965, 48 y.o.   MRN: [id number]  HPI Comments: "I have pain in this toe"  Patient c/o sharp, burning sensation 4th toe right for few months. Walking a lot makes worse. No injury. No home treatment.  Toe Pain       Review of Systems  Musculoskeletal: Positive for back pain.  All other systems reviewed and are negative.      Objective:   Physical Exam        Assessment & Plan:

## 2014-03-12 ENCOUNTER — Ambulatory Visit (INDEPENDENT_AMBULATORY_CARE_PROVIDER_SITE_OTHER): Payer: PRIVATE HEALTH INSURANCE | Admitting: Podiatry

## 2014-03-12 ENCOUNTER — Encounter: Payer: Self-pay | Admitting: Podiatry

## 2014-03-12 VITALS — BP 128/84 | HR 76 | Resp 16

## 2014-03-12 DIAGNOSIS — G5761 Lesion of plantar nerve, right lower limb: Secondary | ICD-10-CM

## 2014-03-12 DIAGNOSIS — G5781 Other specified mononeuropathies of right lower limb: Secondary | ICD-10-CM

## 2014-03-12 NOTE — Progress Notes (Signed)
Subjective:     Patient ID: Corey Dyer, male   DOB: 04-Dec-1965, 48 y.o.   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 At this time I did a sterile prep of the right forefoot injected directly into the third intermetatarsal space with a purified alcohol Marcaine solution of 1.3 mL and advised on seen back again in 2 weeks

## 2014-03-26 ENCOUNTER — Ambulatory Visit (INDEPENDENT_AMBULATORY_CARE_PROVIDER_SITE_OTHER): Payer: PRIVATE HEALTH INSURANCE | Admitting: Podiatry

## 2014-03-26 ENCOUNTER — Ambulatory Visit: Payer: PRIVATE HEALTH INSURANCE | Admitting: Podiatry

## 2014-03-26 VITALS — BP 138/89 | HR 75 | Resp 16

## 2014-03-26 DIAGNOSIS — G5781 Other specified mononeuropathies of right lower limb: Secondary | ICD-10-CM

## 2014-03-26 DIAGNOSIS — G5761 Lesion of plantar nerve, right lower limb: Secondary | ICD-10-CM

## 2014-03-27 NOTE — Progress Notes (Signed)
Subjective:     Patient ID: Corey Dyer, male   DOB: [birth year], 49 y.o.   MRN: [id number]  HPI patient states he still having symptoms with his right foot but it continues to be much better than before we started treatment   Review of Systems     Objective:   Physical Exam Neurovascular status intact with negative Homans sign noted right and noted to have moderate discomfort third interspace right upon palpation    Assessment:     Continue neuroma symptomatology right which has improved but is stable    Plan:     Reinjected the third interspace with a purified alcohol solution and Marcaine that was tolerated well by patient and reappoint again in 4 weeks

## 2014-04-23 ENCOUNTER — Encounter: Payer: Self-pay | Admitting: Podiatry

## 2014-04-23 ENCOUNTER — Ambulatory Visit (INDEPENDENT_AMBULATORY_CARE_PROVIDER_SITE_OTHER): Payer: PRIVATE HEALTH INSURANCE | Admitting: Podiatry

## 2014-04-23 VITALS — BP 134/85 | HR 71 | Resp 16

## 2014-04-23 DIAGNOSIS — G5761 Lesion of plantar nerve, right lower limb: Secondary | ICD-10-CM | POA: Diagnosis not present

## 2014-04-23 DIAGNOSIS — G5781 Other specified mononeuropathies of right lower limb: Secondary | ICD-10-CM

## 2014-04-23 NOTE — Progress Notes (Signed)
Subjective:     Patient ID: Corey Dyer, male   DOB: 08/03/1965, 49 y.o.   MRN: [id number]  HPI patient presents stating my foot feels a lot better on my right foot with minimal discomfort or shooting pain   Review of Systems     Objective:   Physical Exam Neurovascular status intact with muscle strength adequate and minimal discomfort when the third interspace right is palpated with a negative Yancey FlemingsMulder sign currently    Assessment:     Doing better from neuroma symptomatology right    Plan:     H&P and conditions discussed and advised on wider-type shoes and no current injections and less symptoms were to reoccur. Reappoint for us to recheck as needed

## 2014-08-12 IMAGING — CR DG ANKLE COMPLETE 3+V*R*
3 series · 3 of 3 positions shown · non-contrast
Comparison: None.

CLINICAL DATA: Lateral and posterior right ankle pain.

EXAM:
RIGHT ANKLE - COMPLETE 3+ VIEW

[x ankle ap right]
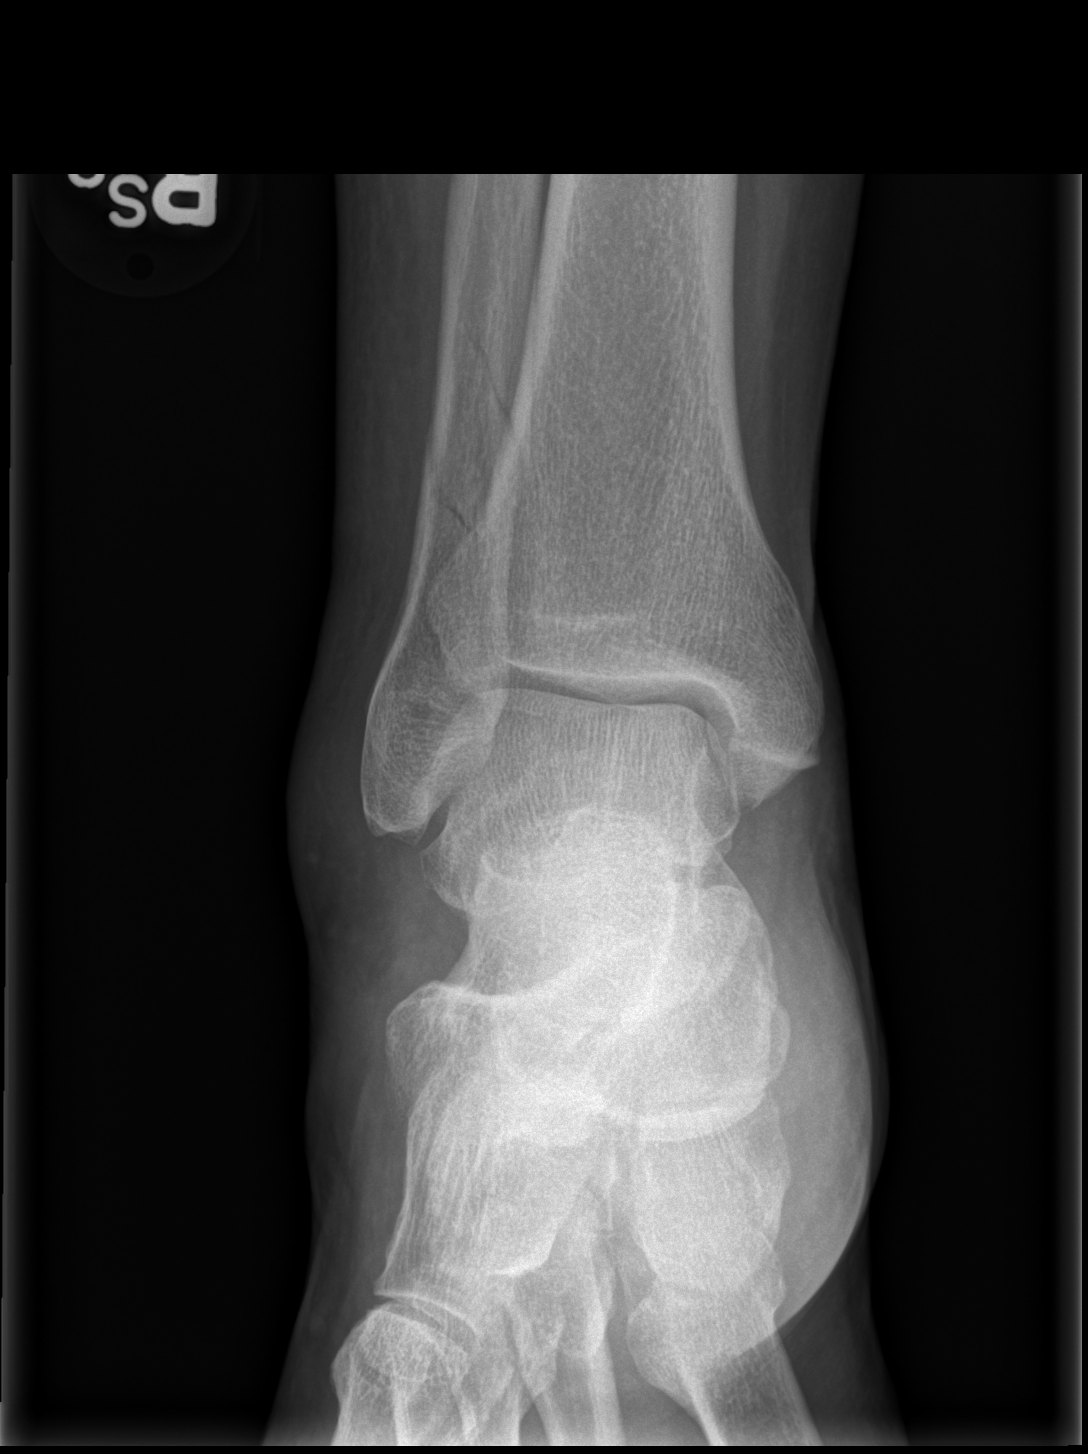

[x ankle obl right]
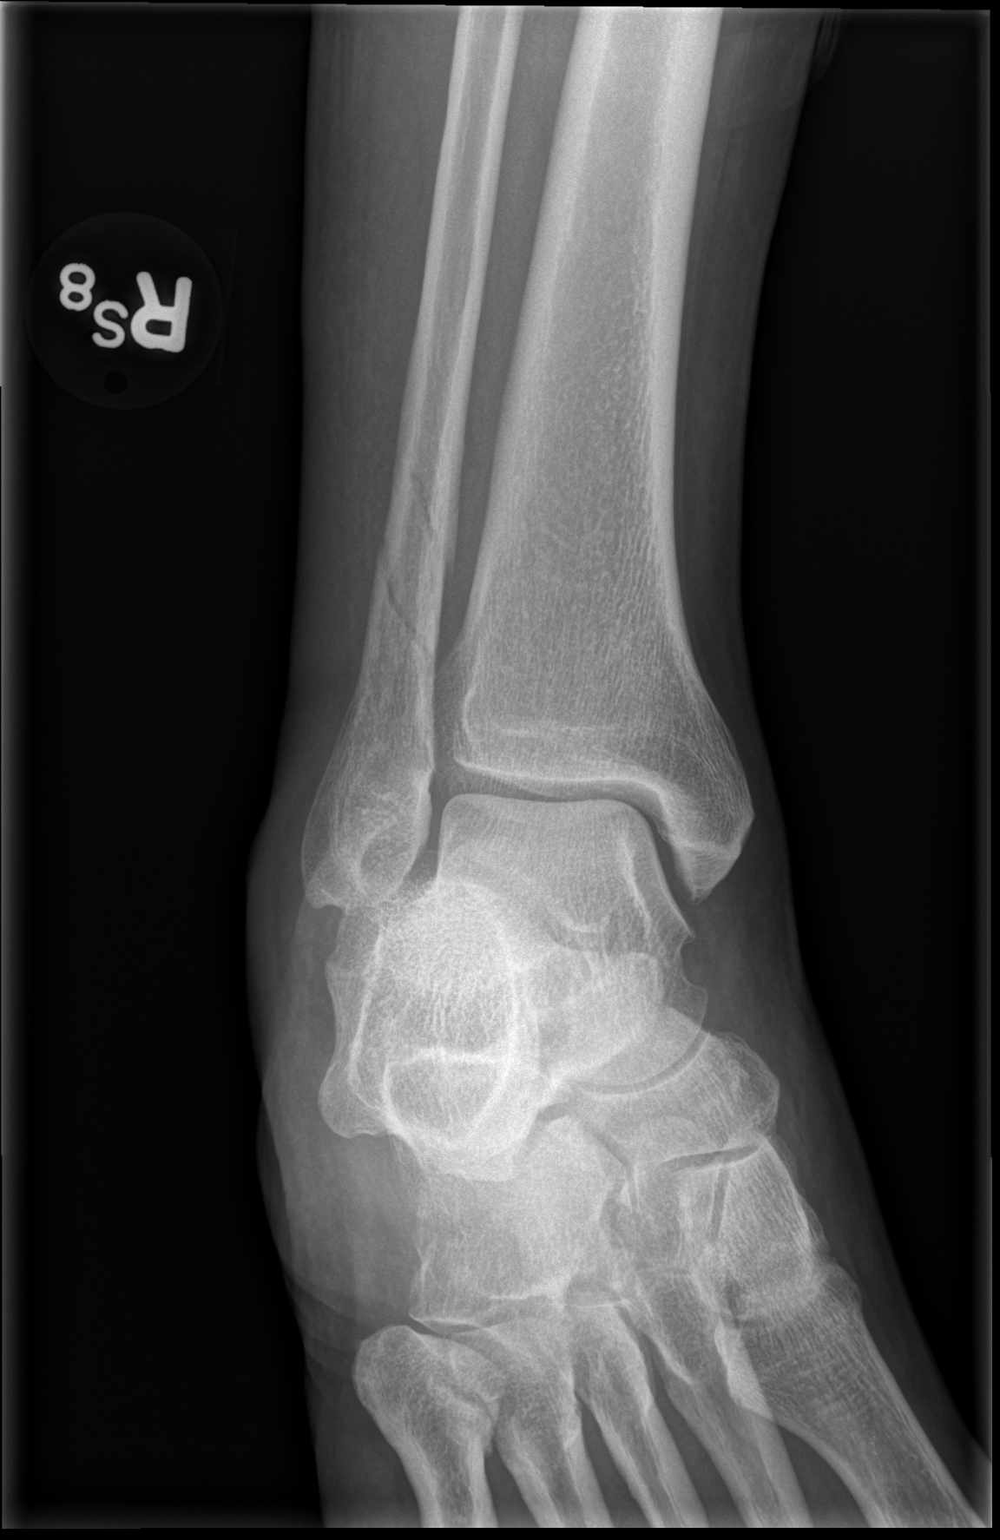

[x ankle lat right]
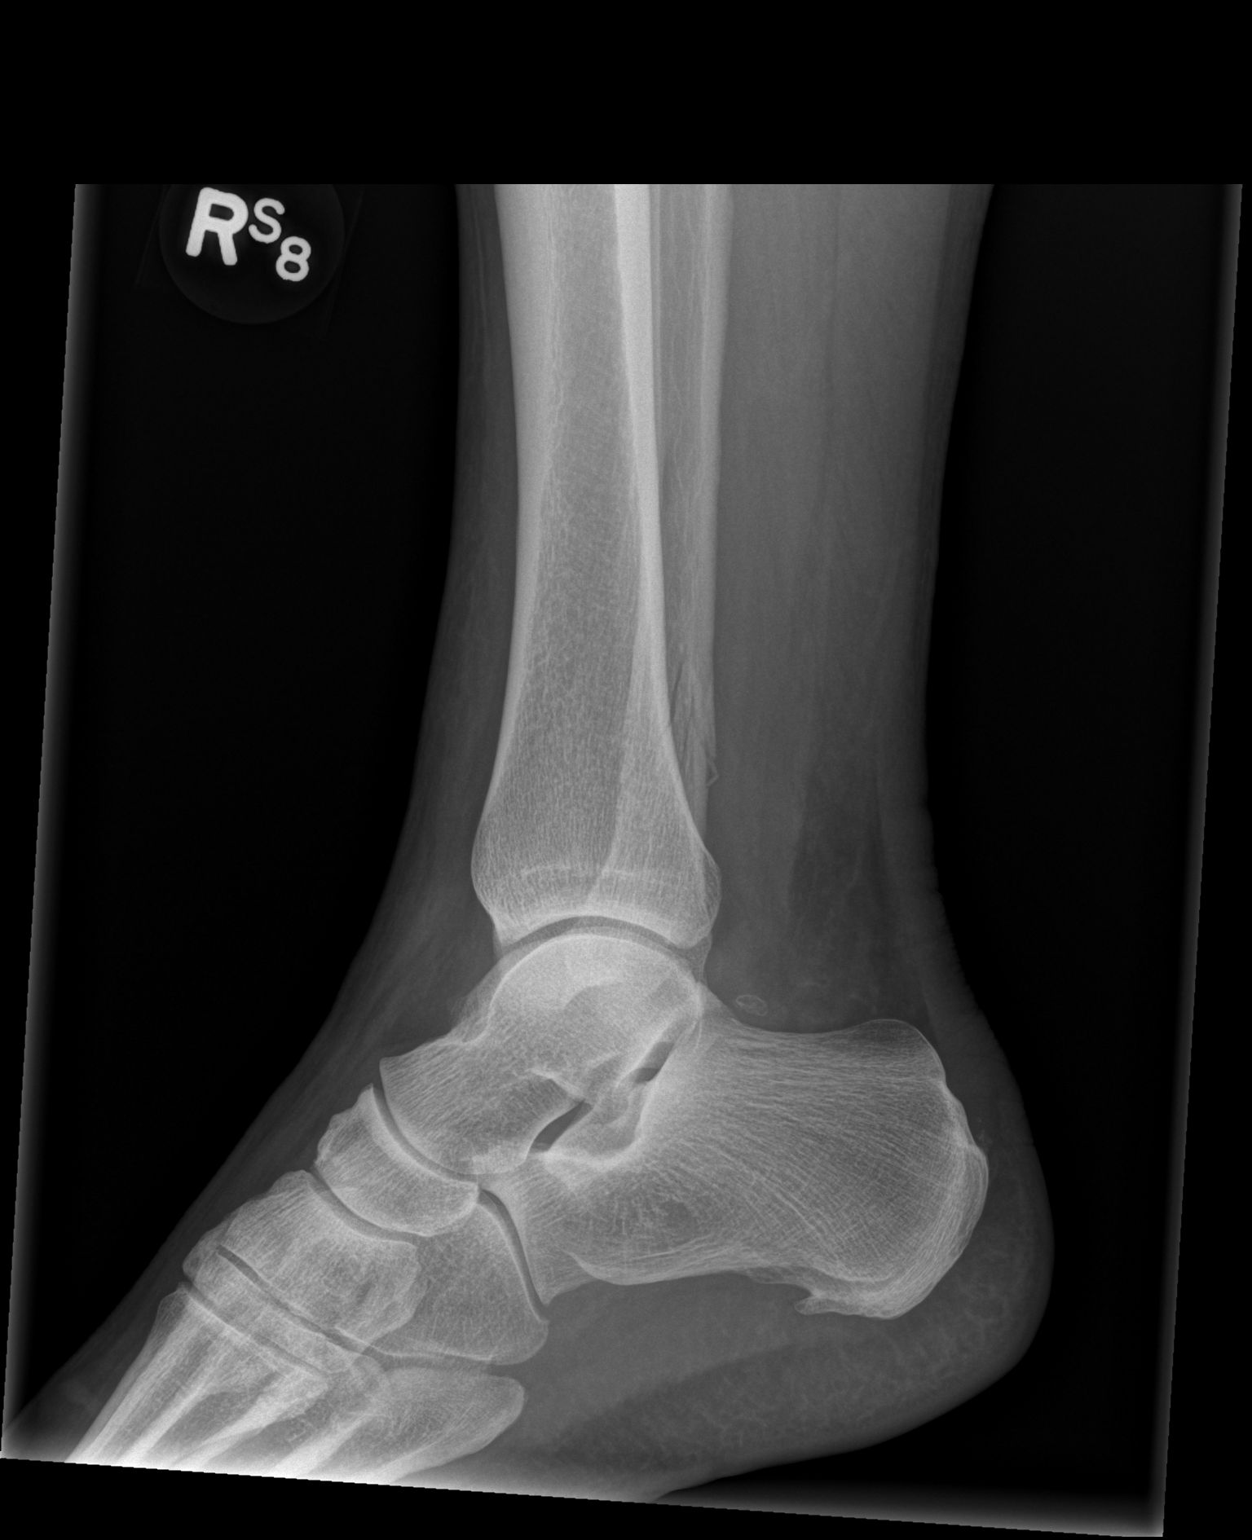

[3 of 3 positions shown; findings below may reference images not displayed]

FINDINGS: The patient has a nondisplaced, acute spiral fracture of the distal
diaphysis and metaphysis of the right fibula with associated soft
tissue swelling. No other acute bony or joint abnormality is
identified. Small plantar calcaneal spur is noted.
IMPRESSION: Nondisplaced spiral fracture distal fibula.

## 2015-10-24 ENCOUNTER — Encounter: Payer: Self-pay | Admitting: Family Medicine

## 2015-10-24 ENCOUNTER — Ambulatory Visit (INDEPENDENT_AMBULATORY_CARE_PROVIDER_SITE_OTHER): Payer: BLUE CROSS/BLUE SHIELD | Admitting: Family Medicine

## 2015-10-24 VITALS — BP 140/90 | HR 87 | Ht 69.0 in | Wt 273.0 lb

## 2015-10-24 DIAGNOSIS — Z23 Encounter for immunization: Secondary | ICD-10-CM

## 2015-10-24 DIAGNOSIS — B070 Plantar wart: Secondary | ICD-10-CM | POA: Diagnosis not present

## 2015-10-24 DIAGNOSIS — Z0000 Encounter for general adult medical examination without abnormal findings: Secondary | ICD-10-CM | POA: Diagnosis not present

## 2015-10-24 LAB — POCT URINALYSIS DIPSTICK
BILIRUBIN UA: NEGATIVE
Glucose, UA: NEGATIVE
KETONES UA: NEGATIVE
Leukocytes, UA: NEGATIVE
Nitrite, UA: NEGATIVE
PH UA: 6
Protein, UA: NEGATIVE
RBC UA: NEGATIVE
SPEC GRAV UA: 1.015
Urobilinogen, UA: NEGATIVE

## 2015-10-24 LAB — CBC WITH DIFFERENTIAL/PLATELET
BASOS PCT: 1 %
Basophils Absolute: 82 cells/uL (ref 0–200)
EOS ABS: 164 {cells}/uL (ref 15–500)
Eosinophils Relative: 2 %
HEMATOCRIT: 46.6 % (ref 38.5–50.0)
Hemoglobin: 15.6 g/dL (ref 13.2–17.1)
LYMPHS PCT: 24 %
Lymphs Abs: 1968 cells/uL (ref 850–3900)
MCH: 30.3 pg (ref 27.0–33.0)
MCHC: 33.5 g/dL (ref 32.0–36.0)
MCV: 90.5 fL (ref 80.0–100.0)
MONO ABS: 656 {cells}/uL (ref 200–950)
MONOS PCT: 8 %
MPV: 9.8 fL (ref 7.5–12.5)
NEUTROS ABS: 5330 {cells}/uL (ref 1500–7800)
Neutrophils Relative %: 65 %
PLATELETS: 260 10*3/uL (ref 140–400)
RBC: 5.15 MIL/uL (ref 4.20–5.80)
RDW: 14.3 % (ref 11.0–15.0)
WBC: 8.2 10*3/uL (ref 4.0–10.5)

## 2015-10-24 NOTE — Progress Notes (Signed)
Subjective:    Patient ID: Corey Dyer, male    DOB: 1965/06/15, 50 y.o.   MRN: [id number]  HPI He is here for complete examination. He has had difficulty recently psychologically feeling overwhelmed with general life issues. In the past he has had some difficulty and has been involved in counseling. He does have an appointment set up tomorrow. He cites no particular thing other than just general life stresses. 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 It is intermittent in nature. He has had no associated weakness, numbness, headache, blurred or double vision. He's had no other difficulties. No chest pain, shortness of breath, GI issues.Family and social history as well as health maintenance and immunizations was reviewed. His marriage and work are going well. He has 3 children aged 3, 86 and 71. His exercise isusually twice per week of walking.   Review of Systems  All other systems reviewed and are negative.      Objective:   Physical Exam BP 140/90 (BP Location: Left Arm, Patient Position: Sitting, Cuff Size: Large)   Pulse 87   Ht 5\' 9"  (1.753 m)   Wt 273 lb (123.8 kg)   BMI 40.32 kg/m   General Appearance:    Alert, cooperative, no distress, appears stated age  Head:    Normocephalic, without obvious abnormality, atraumatic  Eyes:    PERRL, conjunctiva/corneas clear, EOM's intact, fundi    benign  Ears:    Normal TM's and external ear canals  Nose:   Nares normal, mucosa normal, no drainage or sinus   tenderness  Throat:   Lips, mucosa, and tongue normal; teeth and gums normal  Neck:   Supple, no lymphadenopathy;  thyroid:  no   enlargement/tenderness/nodules; no carotid   bruit or JVD  Back:    Spine nontender, no curvature, ROM normal, no CVA     tenderness  Lungs:     Clear to auscultation bilaterally without wheezes, rales or     ronchi; respirations unlabored  Chest Wall:    No tenderness or deformity   Heart:     Regular rate and rhythm, S1 and S2 normal, no murmur, rub   or gallop  Breast Exam:    No chest wall tenderness, masses or gynecomastia  Abdomen:     Soft, non-tender, nondistended, normoactive bowel sounds,    no masses, no hepatosplenomegaly  Genitalia:    Normal male external genitalia without lesions.  Testicles without masses.  No inguinal hernias.  Rectal:  Deferred  Extremities:   No clubbing, cyanosis or edema  Pulses:   2+ and symmetric all extremities  Skin:   Skin color, texture, turgor normal, no rashes.plantar wart noted on the right foot lateral surface  Lymph nodes:   Cervical, supraclavicular, and axillary nodes normal  Neurologic:   CNII-XII intact, normal strength, sensation and gait; reflexes 2+ and symmetric throughout          Psych:   Normal mood, affect, hygiene and grooming.          Assessment & Plan:  Routine general medical examination at a health care facility - Plan: POCT Urinalysis Dipstick, CBC with Differential/Platelet, Comprehensive metabolic panel, Lipid panel  Plantar wart of right foot  Morbid obesity due to excess calories (HCC)  Need for prophylactic vaccination with combined diphtheria-tetanus-pertussis (DTP) vaccine - Plan: Tdap vaccine greater than or equal to 7yo IM Discussed the treatment of the wart with  Compound W on a weekly basis.Also encouraged him to get more physically active. Discussed cutting back on carbohydrates specifically cutting back on "white food" Also encouraged him to continue with counseling to help deal with general life stresses. He was also given information concerning colon cancer screening. He will check with his insurance to see if Cologuard will be covered and let me know whether we he will get that versus regular colonoscopy.

## 2015-10-24 NOTE — Patient Instructions (Signed)
20 minutes everyday of something physical. Use Compound W daily for the next 3 days and put a Band-Aid over it. Then scrape it off. On a weekly basis keep doing this

## 2015-10-25 LAB — COMPREHENSIVE METABOLIC PANEL
ALK PHOS: 64 U/L (ref 40–115)
ALT: 18 U/L (ref 9–46)
AST: 15 U/L (ref 10–35)
Albumin: 4.2 g/dL (ref 3.6–5.1)
BILIRUBIN TOTAL: 0.6 mg/dL (ref 0.2–1.2)
BUN: 10 mg/dL (ref 7–25)
CO2: 24 mmol/L (ref 20–31)
Calcium: 9.4 mg/dL (ref 8.6–10.3)
Chloride: 101 mmol/L (ref 98–110)
Creat: 0.76 mg/dL (ref 0.70–1.33)
GLUCOSE: 85 mg/dL (ref 65–99)
Potassium: 4.1 mmol/L (ref 3.5–5.3)
Sodium: 138 mmol/L (ref 135–146)
TOTAL PROTEIN: 7.4 g/dL (ref 6.1–8.1)

## 2015-10-25 LAB — LIPID PANEL
CHOLESTEROL: 182 mg/dL (ref 125–200)
HDL: 47 mg/dL (ref >=40)
LDL Cholesterol: 105 mg/dL (ref <130)
Total CHOL/HDL Ratio: 3.9 Ratio (ref <=5.0)
Triglycerides: 151 mg/dL — ABNORMAL HIGH (ref <150)
VLDL: 30 mg/dL (ref <30)

## 2016-02-18 ENCOUNTER — Encounter: Payer: Self-pay | Admitting: Family Medicine

## 2016-02-18 ENCOUNTER — Ambulatory Visit (INDEPENDENT_AMBULATORY_CARE_PROVIDER_SITE_OTHER): Payer: BLUE CROSS/BLUE SHIELD | Admitting: Family Medicine

## 2016-02-18 VITALS — BP 132/78 | HR 84 | Wt 274.0 lb

## 2016-02-18 DIAGNOSIS — R030 Elevated blood-pressure reading, without diagnosis of hypertension: Secondary | ICD-10-CM | POA: Diagnosis not present

## 2016-02-18 NOTE — Progress Notes (Signed)
   Subjective:    Patient ID: Corey Salononald W Novosad Jr., male    DOB: [birth year], 50 y.o.   MRN: [id number]  HPI He is here for consult concerning his blood pressure. He is presently involved in counseling and apparently every time he is gone and they have noted an elevated blood pressure. He has made some dietary changes in the last month or so. He does walk 1 hour 3 times per week with his dog.   Review of Systems     Objective:   Physical Exam Alert and in no distress. Blood pressure is recorded.       Assessment & Plan:  Elevated blood pressure reading  Morbid obesity due to excess calories (HCC) I reviewed his blood pressure readings over the last year and he is borderline. I discussed diet and exercise and its affect on his blood pressure. Specimens since he is morbidly obese. Recommended continuing to exercise 3 days week and potentially adding even more exercise while at work meaning getting up and walking around when he has a break. Also discussed calorie consumption regard to vegetable fats, lean meats, cutting back on carbohydrates. Plan to recheck this in about 2 months.

## 2016-02-18 NOTE — Patient Instructions (Signed)
Stay away from animal fats and stick with vegetable fats like olive oil. Lean meats. Cut back on carbs in the easiest way to remember it is if it's white you back off Keep up the exercise but maybe add to it during the day while you're work

## 2019-02-14 ENCOUNTER — Other Ambulatory Visit: Payer: Self-pay

## 2019-02-14 DIAGNOSIS — Z20822 Contact with and (suspected) exposure to covid-19: Secondary | ICD-10-CM

## 2019-02-16 LAB — NOVEL CORONAVIRUS, NAA: SARS-CoV-2, NAA: NOT DETECTED

## 2019-02-23 ENCOUNTER — Other Ambulatory Visit: Payer: Self-pay

## 2019-02-23 DIAGNOSIS — Z20822 Contact with and (suspected) exposure to covid-19: Secondary | ICD-10-CM

## 2019-02-26 LAB — NOVEL CORONAVIRUS, NAA: SARS-CoV-2, NAA: NOT DETECTED

## 2021-11-26 ENCOUNTER — Encounter: Payer: Self-pay | Admitting: Internal Medicine

## 2021-12-30 ENCOUNTER — Encounter: Payer: Self-pay | Admitting: Internal Medicine

## 2022-01-12 ENCOUNTER — Encounter: Payer: Self-pay | Admitting: Internal Medicine

## 2023-05-18 ENCOUNTER — Encounter: Payer: Self-pay | Admitting: Internal Medicine

## 2023-06-30 DIAGNOSIS — H68109 Unspecified obstruction of Eustachian tube, unspecified ear: Secondary | ICD-10-CM | POA: Diagnosis not present

## 2023-07-06 ENCOUNTER — Emergency Department (HOSPITAL_COMMUNITY)
Admission: EM | Admit: 2023-07-06 | Discharge: 2023-07-07 | Discharge status: Against Medical Advice | Attending: Emergency Medicine | Admitting: Emergency Medicine

## 2023-07-06 ENCOUNTER — Other Ambulatory Visit: Payer: Self-pay

## 2023-07-06 ENCOUNTER — Encounter (HOSPITAL_COMMUNITY): Payer: Self-pay | Admitting: Emergency Medicine

## 2023-07-06 DIAGNOSIS — R030 Elevated blood-pressure reading, without diagnosis of hypertension: Secondary | ICD-10-CM | POA: Diagnosis not present

## 2023-07-06 DIAGNOSIS — Z6838 Body mass index (BMI) 38.0-38.9, adult: Secondary | ICD-10-CM | POA: Diagnosis not present

## 2023-07-06 DIAGNOSIS — R0981 Nasal congestion: Secondary | ICD-10-CM | POA: Diagnosis not present

## 2023-07-06 DIAGNOSIS — Z5321 Procedure and treatment not carried out due to patient leaving prior to being seen by health care provider: Secondary | ICD-10-CM | POA: Diagnosis not present

## 2023-07-06 DIAGNOSIS — H9203 Otalgia, bilateral: Secondary | ICD-10-CM | POA: Insufficient documentation

## 2023-07-06 DIAGNOSIS — I10 Essential (primary) hypertension: Secondary | ICD-10-CM | POA: Insufficient documentation

## 2023-07-06 LAB — BASIC METABOLIC PANEL WITH GFR
Anion gap: 11 (ref 5–15)
BUN: 11 mg/dL (ref 6–20)
CO2: 21 mmol/L — ABNORMAL LOW (ref 22–32)
Calcium: 9 mg/dL (ref 8.9–10.3)
Chloride: 105 mmol/L (ref 98–111)
Creatinine, Ser: 0.79 mg/dL (ref 0.61–1.24)
GFR, Estimated: >60 mL/min (ref >60)
Glucose, Bld: 116 mg/dL — ABNORMAL HIGH (ref 70–99)
Potassium: 4 mmol/L (ref 3.5–5.1)
Sodium: 137 mmol/L (ref 135–145)

## 2023-07-06 LAB — CBC
HCT: 45.6 % (ref 39.0–52.0)
Hemoglobin: 15.9 g/dL (ref 13.0–17.0)
MCH: 31.5 pg (ref 26.0–34.0)
MCHC: 34.9 g/dL (ref 30.0–36.0)
MCV: 90.5 fL (ref 80.0–100.0)
Platelets: 278 10*3/uL (ref 150–400)
RBC: 5.04 MIL/uL (ref 4.22–5.81)
RDW: 13.4 % (ref 11.5–15.5)
WBC: 10.7 10*3/uL — ABNORMAL HIGH (ref 4.0–10.5)
nRBC: 0 % (ref 0.0–0.2)

## 2023-07-06 NOTE — ED Triage Notes (Signed)
 Patient coming to ED for evaluation of hypertension.  Reports last week he used Telehealth due to nasal/ear congestion.  Was told to take Sudafed and Mucinex for symptoms.  No relief in congestion.  Today he went to Urgent Care.  On assessment, patient noted to have high blood pressure and was instructed to come to ED for evaluation.  States BP was 190/108.  No reports of chest pain, SHOB, or HA.  No prior hx of HTN

## 2023-07-06 NOTE — ED Provider Triage Note (Signed)
 Emergency Medicine Provider Triage Evaluation Note  Corey Dyer. , a 58 y.o. male  was evaluated in triage.  Pt complains of congestion, hypertension. States he started having congestion last week, did a video visit and was told to take Sudafed.  He has been taking Sudafed every day since then.  Has had some improvement with this, however has had persistent symptoms.  Therefore went to urgent care today and they checked blood pressure and noted that it was 190/100 and recommended he come to the ER for evaluation.  He denies any headaches, vision changes, chest pain, shortness of breath.  No cough.  No fevers or chills.  Only notes bilateral maxillary sinus congestion and bilateral ear pain.  He has no diagnosis of hypertension, however does not check his blood pressure at home and states it has been sometime since he saw a doctor.  Review of Systems  Positive:  Negative:   Physical Exam  BP (!) 173/90   Pulse 97   Temp 97.6 F (36.4 C)   Resp 15   Ht 5\' 9"  (1.753 m)   Wt 119.7 kg   SpO2 99%   BMI 38.99 kg/m  Gen:   Awake, no distress   Resp:  Normal effort  MSK:   Moves extremities without difficulty  Other:    Medical Decision Making  Medically screening exam initiated at 7:43 PM.  Appropriate orders placed.  [name]. was informed that the remainder of the evaluation will be completed by another provider, this initial triage assessment does not replace that evaluation, and the importance of remaining in the ED until their evaluation is complete.  Hypertension likely due to Sudafed use, however given he has not been seen in some time we will order basic labs.  Workup initiated.   Corey Dyer 07/06/23 1946

## 2023-07-06 NOTE — ED Notes (Signed)
Pt left due to excessive wait time.

## 2023-07-14 ENCOUNTER — Ambulatory Visit: Admitting: Family Medicine

## 2023-07-14 VITALS — BP 130/80 | HR 76 | Wt 269.4 lb

## 2023-07-14 DIAGNOSIS — R739 Hyperglycemia, unspecified: Secondary | ICD-10-CM | POA: Diagnosis not present

## 2023-07-14 DIAGNOSIS — R7303 Prediabetes: Secondary | ICD-10-CM | POA: Diagnosis not present

## 2023-07-14 DIAGNOSIS — Z13220 Encounter for screening for lipoid disorders: Secondary | ICD-10-CM

## 2023-07-14 LAB — POCT GLYCOSYLATED HEMOGLOBIN (HGB A1C): Hemoglobin A1C: 6.1 % — AB (ref 4.0–5.6)

## 2023-07-14 NOTE — Progress Notes (Signed)
    Subjective:    Patient ID: Corey Hook., male    DOB: Dec 09, 1965, 58 y.o.   MRN: [id number]  HPI He was [facility] in [facility]  He was given some Sudafed for his allergy symptoms and blood pressure after that showed a systolic of 170.  He did go to the emergency room however the wait was extensive and he left without being seen.  He is here today for follow-up.   Review of Systems     Objective:    Physical Exam Alert and in no distress.  Blood pressure is recorded.  Blood work that was done on April 15 did show slightly elevated blood sugar.       Assessment & Plan:  Elevated blood sugar - Plan: POCT glycosylated hemoglobin (Hb A1C)

## 2023-07-14 NOTE — Progress Notes (Signed)
    Subjective:    Patient ID: Corey Hook., male    DOB: September 04, 1965, 58 y.o.   MRN: [id number]  HPI He was seen recently in [facility].  He was given some Sudafed for his allergy symptoms and blood pressure after that showed a systolic of 170.  He did go to the emergency room however the wait was extensive and he left without being seen.  He is here today for follow-up.   Review of Systems     Objective:    Physical Exam Alert and in no distress.  Blood pressure is recorded.  Blood work that was done on April 15 did show slightly elevated blood sugar. Hemoglobin A1c is 6.1      Assessment & Plan:  Elevated blood sugar - Plan: POCT glycosylated hemoglobin (Hb A1C)  Screening for lipid disorders - Plan: Lipid panel  Prediabetes I reassured him that his blood pressure is fine and discussed proper evaluation and treatment of elevated blood pressure.  Then discussed that hemoglobin A1c of 6.4 and his risk for diabetes.  He will check a lipid panel on him to get him caught up on all of his blood work and have him scheduled for complete exam with Dr. Francina Irish

## 2023-07-15 ENCOUNTER — Encounter: Payer: Self-pay | Admitting: Family Medicine

## 2023-07-15 LAB — LIPID PANEL
Chol/HDL Ratio: 4.4 ratio (ref 0.0–5.0)
Cholesterol, Total: 173 mg/dL (ref 100–199)
HDL: 39 mg/dL — ABNORMAL LOW (ref >39)
LDL Chol Calc (NIH): 108 mg/dL — ABNORMAL HIGH (ref 0–99)
Triglycerides: 146 mg/dL (ref 0–149)
VLDL Cholesterol Cal: 26 mg/dL (ref 5–40)

## 2023-10-18 ENCOUNTER — Encounter: Admitting: Family Medicine

## 2023-10-26 ENCOUNTER — Encounter: Admitting: Family Medicine

## 2023-11-02 ENCOUNTER — Encounter: Payer: Self-pay | Admitting: Family Medicine

## 2023-11-02 ENCOUNTER — Ambulatory Visit (INDEPENDENT_AMBULATORY_CARE_PROVIDER_SITE_OTHER): Admitting: Family Medicine

## 2023-11-02 VITALS — BP 138/84 | HR 107 | Ht 68.0 in | Wt 265.4 lb

## 2023-11-02 DIAGNOSIS — I10 Essential (primary) hypertension: Secondary | ICD-10-CM

## 2023-11-02 DIAGNOSIS — R739 Hyperglycemia, unspecified: Secondary | ICD-10-CM | POA: Diagnosis not present

## 2023-11-02 DIAGNOSIS — Z0000 Encounter for general adult medical examination without abnormal findings: Secondary | ICD-10-CM | POA: Diagnosis not present

## 2023-11-02 LAB — POCT GLYCOSYLATED HEMOGLOBIN (HGB A1C): Hemoglobin A1C: 6 % — AB (ref 4.0–5.6)

## 2023-11-02 NOTE — Progress Notes (Signed)
 Name: Corey Dyer.   Date of Visit: 11/02/23   Date of last visit with me: Visit date not found   CHIEF COMPLAINT:  Chief Complaint  Patient presents with   Annual Exam    Cpe. Fasting. No other concerns.         HPI:  Discussed the use of AI scribe software for clinical note transcription with the patient, who gave verbal consent to proceed.  History of Present Illness   Corey Okazaki. is a 58 year old male who presents for follow-up of elevated blood pressure.  He experienced severe ear blockage, which led to the use of Sudafed. Following this, he had a significant spike in blood pressure, prompting a visit to the emergency room in April. After a week, his blood pressure was no longer elevated.  He describes feeling his heart racing during a blood pressure check. He is not currently on any regular medications for blood pressure but occasionally takes ibuprofen  for soreness or headaches. He does not take it consistently and tries to avoid regular medication use.  He has not had a colonoscopy. He has not received the flu or pneumococcal vaccines but has had the COVID-19 vaccine. He is unsure about his hepatitis B vaccination status and has not had the shingles vaccine.         OBJECTIVE:       11/02/2023    8:30 AM  Depression screen PHQ 2/9  Decreased Interest 0  Down, Depressed, Hopeless 0  PHQ - 2 Score 0     BP Readings from Last 3 Encounters:  11/02/23 138/84  07/14/23 130/80  07/06/23 (!) 173/90    BP 138/84   Pulse (!) 107   Ht 5' 8 (1.727 m)   Wt 265 lb 6.4 oz (120.4 kg)   SpO2 97%   BMI 40.35 kg/m    Physical Exam   VITALS: BP- 142/88      Physical Exam Constitutional:      Appearance: Normal appearance.  Cardiovascular:     Rate and Rhythm: Normal rate and regular rhythm.  Neurological:     General: No focal deficit present.     Mental Status: He is alert and oriented to person, place, and time. Mental status is at baseline.      ASSESSMENT/PLAN:   Assessment & Plan Annual physical exam  Elevated blood sugar  Primary hypertension    Assessment and Plan    Elevated blood pressure Blood pressure at 142/88 mmHg, likely related to weight. No immediate medication needed. - Encourage lifestyle modifications: increased physical activity, dietary changes to reduce weight. - Reassess blood pressure in future visits -.  Previous labs reviewed on 07/06/2023 as well as ER note reviewed from 07/06/2023.  BMP and CBC at that time were normal.  Blood pressure was noted to be elevated but is likely secondary to Sudafed usage. - Discussed with patient that his blood pressure is likely related to weight gain and can consider starting medication but would like to try conservative therapy at this time first.  Patient is agreeable and would like to do that as well.  Overweight Weight stable over four months. Weight management crucial for blood pressure control. - Encourage caloric intake reduction by eliminating one routine meal per day. - Consider GLP-1 for appetite suppression and weight loss if lifestyle modifications fail.  General Health Maintenance No flu, pneumococcal, or shingles vaccines. No colonoscopy history. Discussed preventive measures and Shingrix benefits. - Recommend  colonoscopy. - Discuss Shingrix vaccine benefits, especially with chickenpox history. - Consider flu and pneumococcal vaccines in future visits.         Coralynn Gaona A. Vita MD [facility]

## 2023-11-03 ENCOUNTER — Ambulatory Visit: Payer: Self-pay | Admitting: Family Medicine

## 2023-11-03 LAB — HEPATITIS B SURFACE ANTIGEN: Hepatitis B Surface Ag: NEGATIVE

## 2023-11-03 LAB — HIV ANTIBODY (ROUTINE TESTING W REFLEX): HIV Screen 4th Generation wRfx: NONREACTIVE

## 2023-11-03 LAB — HEPATITIS C ANTIBODY: Hep C Virus Ab: NONREACTIVE

## 2024-01-14 ENCOUNTER — Encounter: Payer: Self-pay | Admitting: Family Medicine

## 2024-11-02 ENCOUNTER — Encounter: Payer: Self-pay | Admitting: Family Medicine
